# Patient Record
Sex: Female | Born: 1960 | Race: White | Hispanic: No | Marital: Married
Health system: Southern US, Community
[De-identification: ages and names within clinical notes are randomized; demographics above are authoritative.]

---

## 1998-03-10 ENCOUNTER — Other Ambulatory Visit: Admission: RE | Admit: 1998-03-10 | Discharge: 1998-03-10 | Payer: Self-pay | Admitting: Obstetrics and Gynecology

## 1999-03-13 ENCOUNTER — Other Ambulatory Visit: Admission: RE | Admit: 1999-03-13 | Discharge: 1999-03-13 | Payer: Self-pay | Admitting: Obstetrics and Gynecology

## 1999-03-30 ENCOUNTER — Other Ambulatory Visit: Admission: RE | Admit: 1999-03-30 | Discharge: 1999-03-30 | Payer: Self-pay | Admitting: Obstetrics and Gynecology

## 1999-03-30 ENCOUNTER — Encounter (INDEPENDENT_AMBULATORY_CARE_PROVIDER_SITE_OTHER): Payer: Self-pay

## 1999-04-10 ENCOUNTER — Encounter (INDEPENDENT_AMBULATORY_CARE_PROVIDER_SITE_OTHER): Payer: Self-pay | Admitting: Specialist

## 1999-04-10 ENCOUNTER — Ambulatory Visit (HOSPITAL_COMMUNITY): Admission: RE | Admit: 1999-04-10 | Discharge: 1999-04-10 | Payer: Self-pay | Admitting: Obstetrics and Gynecology

## 1999-08-17 ENCOUNTER — Other Ambulatory Visit: Admission: RE | Admit: 1999-08-17 | Discharge: 1999-08-17 | Payer: Self-pay | Admitting: Obstetrics and Gynecology

## 1999-12-26 ENCOUNTER — Other Ambulatory Visit: Admission: RE | Admit: 1999-12-26 | Discharge: 1999-12-26 | Payer: Self-pay | Admitting: Obstetrics and Gynecology

## 2000-03-28 ENCOUNTER — Other Ambulatory Visit: Admission: RE | Admit: 2000-03-28 | Discharge: 2000-03-28 | Payer: Self-pay | Admitting: Obstetrics and Gynecology

## 2000-10-03 ENCOUNTER — Other Ambulatory Visit: Admission: RE | Admit: 2000-10-03 | Discharge: 2000-10-03 | Payer: Self-pay | Admitting: Obstetrics and Gynecology

## 2001-03-20 ENCOUNTER — Encounter: Admission: RE | Admit: 2001-03-20 | Discharge: 2001-03-20 | Payer: Self-pay | Admitting: Obstetrics and Gynecology

## 2001-03-20 ENCOUNTER — Encounter: Payer: Self-pay | Admitting: Obstetrics and Gynecology

## 2001-03-20 ENCOUNTER — Other Ambulatory Visit: Admission: RE | Admit: 2001-03-20 | Discharge: 2001-03-20 | Payer: Self-pay | Admitting: Obstetrics and Gynecology

## 2001-08-12 ENCOUNTER — Other Ambulatory Visit: Admission: RE | Admit: 2001-08-12 | Discharge: 2001-08-12 | Payer: Self-pay | Admitting: Obstetrics and Gynecology

## 2002-03-31 ENCOUNTER — Other Ambulatory Visit: Admission: RE | Admit: 2002-03-31 | Discharge: 2002-03-31 | Payer: Self-pay | Admitting: Obstetrics and Gynecology

## 2002-03-31 ENCOUNTER — Encounter: Payer: Self-pay | Admitting: Obstetrics and Gynecology

## 2002-03-31 ENCOUNTER — Encounter: Admission: RE | Admit: 2002-03-31 | Discharge: 2002-03-31 | Payer: Self-pay | Admitting: Obstetrics and Gynecology

## 2003-08-10 ENCOUNTER — Encounter: Admission: RE | Admit: 2003-08-10 | Discharge: 2003-08-10 | Payer: Self-pay | Admitting: Obstetrics and Gynecology

## 2003-12-16 ENCOUNTER — Encounter (INDEPENDENT_AMBULATORY_CARE_PROVIDER_SITE_OTHER): Payer: Self-pay | Admitting: Specialist

## 2003-12-16 ENCOUNTER — Observation Stay (HOSPITAL_COMMUNITY): Admission: RE | Admit: 2003-12-16 | Discharge: 2003-12-17 | Payer: Self-pay | Admitting: Obstetrics and Gynecology

## 2004-08-10 ENCOUNTER — Encounter: Admission: RE | Admit: 2004-08-10 | Discharge: 2004-08-10 | Payer: Self-pay | Admitting: Otolaryngology

## 2005-10-01 ENCOUNTER — Encounter: Admission: RE | Admit: 2005-10-01 | Discharge: 2005-10-01 | Payer: Self-pay | Admitting: Family Medicine

## 2006-10-22 ENCOUNTER — Encounter: Admission: RE | Admit: 2006-10-22 | Discharge: 2006-10-22 | Payer: Self-pay | Admitting: Family Medicine

## 2007-10-23 ENCOUNTER — Encounter: Admission: RE | Admit: 2007-10-23 | Discharge: 2007-10-23 | Payer: Self-pay | Admitting: Family Medicine

## 2008-10-29 ENCOUNTER — Encounter: Admission: RE | Admit: 2008-10-29 | Discharge: 2008-10-29 | Payer: Self-pay | Admitting: Family Medicine

## 2009-11-01 ENCOUNTER — Encounter: Admission: RE | Admit: 2009-11-01 | Discharge: 2009-11-01 | Payer: Self-pay | Admitting: Family Medicine

## 2010-02-21 ENCOUNTER — Other Ambulatory Visit
Admission: RE | Admit: 2010-02-21 | Discharge: 2010-02-21 | Payer: Self-pay | Source: Home / Self Care | Admitting: Obstetrics and Gynecology

## 2010-06-30 NOTE — H&P (Signed)
Katherine Fox, Katherine Fox                 ACCOUNT NO.:  [id number]   MEDICAL RECORD NO.:  [id number]          PATIENT TYPE:  AMB   LOCATION:  SDC                           FACILITY:  [facility]   PHYSICIAN:  Lenoard Aden, M.D.DATE OF BIRTH:  [birth year]   DATE OF ADMISSION:  12/16/2003  DATE OF DISCHARGE:                                HISTORY & PHYSICAL   CHIEF COMPLAINT:  Persistent refractory menomenorrhagia.   HISTORY OF PRESENT ILLNESS:  The patient is a 50 year old white female,  gravida 1, para 1, with history of intermittent hematometra with a history  of cone biopsy and secondary cervical stenosis who presents for definitive  therapy.   ALLERGIES:  No known drug allergies.   MEDICATIONS:  Celebrex 20 mg p.o. p.r.n.   FAMILY HISTORY:  Noncontributory.  Domestic history is noncontributory as  well.   PAST SURGICAL HISTORY:  She does have a history of cold knife conization due  to severe dysplasia in 2001.   PAST OBSTETRICAL HISTORY:  Vaginal delivery uncomplicated.   PHYSICAL EXAMINATION:  GENERAL:  She is a well-developed, well-nourished,  white female in no acute distress.  HEENT:  Normal.  LUNGS:  Clear.  HEART:  Regular rate and rhythm.  ABDOMEN:  Soft and nontender.  PELVIC:  Anteflexed, not enlarged uterus with known cervical stenosis.  No  adnexal masses.   IMPRESSION:  1.  Menomenorrhagia.  2.  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  3.  History of severe dysplasia, status post cone biopsy.  4.  History of urethral dilatation and cystoscopy.   PLAN:  Proceed to laparoscopically-assisted vaginal hysterectomy, possible  abdominal hysterectomy.  Risks of anesthesia, infection, bleeding, injury to  abdominal organs with need for repair is discussed.  Delayed versus  immediate complications to include bowel and bladder injury noted.  The  patient acknowledges and wishes to proceed.      RJT/MEDQ  D:  12/15/2003  T:  12/15/2003  Job:  [id number]

## 2010-06-30 NOTE — Op Note (Signed)
[facility]  Patient:    Katherine Fox, Katherine Fox                        MRN: [id number] Proc. Date: 04/10/99 Adm. Date:  [id number] Disc. Date: [id number] Attending:  Lenoard Aden                           Operative Report  THIS NOTE WAS DICTATED PREVIOUSLY ON April 10, 1999.  DICTATION HAS BEEN LOST.  PREOPERATIVE DIAGNOSIS:       Severe dysplasia endocervical involvement.  POSTOPERATIVE DIAGNOSIS:      Severe dysplasia endocervical involvement.  OPERATION:                    Cold knife cervical conization and ECC.  SURGEON:                      Lenoard Aden, M.D.  ASSISTANT:  ANESTHESIA:                   General  ESTIMATED BLOOD LOSS:         50 cc  COMPLICATIONS:                None.  DRAINS:                       None.  COUNTS:                       Correct  DISPOSITION:                  The patient to recovery room in good condition.  SPECIMEN:                     Cone biopsy to pathology.  INDICATIONS:  DESCRIPTION OF PROCEDURE:     After being appraised of the risks of anesthesia,  infection, bleeding and inability to excise the entire specimen, the patient was brought to the operating room where she was administered general anesthetic without complications.  Feet are placed in the [facility] stirrups. At this time, an exam under anesthesia revealed a normal size uterus.  No adnexal masses.  Bivalve speculum was placed.  The cervix was coated using a Lugols solution and Stay sutures are placed at the cervical vaginal junction at 3 and 9:00 with 0 chromic sutures and held long.  The cervix was infiltrated with dilute xylocaine epinephrine solution and excision around the area of previously delineated transformation zone with Lugols solution is excised using a Beaver blade.  This is excised using scissors at the level of the endocervical margin and tagged at 12:00.  ECC is performed.  [place]anga is then cauterized.  Monsels  solution placed.  A piece of Surgicel is placed in  the cone bed and the Stay sutures at 3 and 9:00 are tied over the midline and cut. The procedure goes without difficulty.  There are no complications.  The patient tolerates the procedure well, to recovery room in good condition.DD:  05/03/99 TD:  05/04/99 Job: [id number]

## 2010-06-30 NOTE — Op Note (Signed)
NAMENORENA, BRATTON                 ACCOUNT NO.:  [id number]   MEDICAL RECORD NO.:  [id number]          PATIENT TYPE:  OBV   LOCATION:  9399                          FACILITY:  [facility]   PHYSICIAN:  Lenoard Aden, M.D.DATE OF BIRTH:  09/07/1960   DATE OF PROCEDURE:  12/16/2003  DATE OF DISCHARGE:                                 OPERATIVE REPORT   PREOPERATIVE DIAGNOSES:  1.  Recurrent menometrorrhagia.  2.  Cervical stenosis.  3.  Recurrent hematometria.   POSTOPERATIVE DIAGNOSES:  1.  Recurrent menometrorrhagia.  2.  Cervical stenosis.  3.  Recurrent hematometria.  4.  Enterocele.   PROCEDURE:  LAVH and McCall culdoplasty.   SURGEON:  Lenoard Aden, M.D.   ASSISTANT:  Genia Del, M.D.   ANESTHESIA:  Gen.   ESTIMATED BLOOD LOSS:  100 mL.   DRAINS:  Foley and vaginal pack.   COUNTS:  Correct.   The patient to recovery in good condition.   DESCRIPTION OF PROCEDURE:  After being apprised of the risks of anesthesia,  infection, bleeding, injury to abdominal organs, need for repair, delayed  versus immediate complications to include bowel and injury are noted. The  patient was brought to the operating room where she was administered general  anesthetic without complications.  Prepped and draped in the usual sterile  fashion, Foley catheter placed.  A vaginal exam reveals a small anteflexed  uterus, marked cervical stenosis which is relieved with the placement of the  hulka tenaculum.  Hematometria is relieved at this time.  Infraumbilical  incision made with a scalpel, after placement of a dilute Marcaine solution  Veress needle placed, opening pressure of -2 noted, 3 1/2 liters CO2  insufflated without difficulty.  A 10 mm trocar placed, 5 mm trocar sites  placed bilaterally mid clavicular line halfway in between the suprapubic and  umbilical area. There is some bleeding on placement of the right 5 mm trocar  site, both are done under local anesthetic. After  this, the left round  ligament is grasped and ligated using a tripolar. The tuboovarian round  ligament complex is then ligated as well with a tripolar. Bladder flap is  developed sharply, the same procedure is done on the right side. The uterine  vessels are skeletonized, ureters are identified. Anterior and posterior cul-  de-sac appear normal. There are bilateral normal ovaries, bilateral normal  tubes.  Appendix not visualized, gallbladder appears normal. At this time,  attention turned to the vaginal portion of the procedure whereby anterior  and posterior lip of the cervix is grasped, infiltrated using a dilute  Pitressin solution, circumscribed using electrocautery at the cervicovaginal  junction. Anterior and posterior cul-de-sac entry made sharply and without  difficulty and atraumatically retractors placed.  Uterosacral ligaments are  bilaterally clamped, suture ligated and attached to the vaginal cuff.  Ligature is used to clamp the uterine vessels and broad and cardinal  ligament complexes.  Specimen is removed.  McCall culdoplasty suture placed  using 2-0 Vicryl sutures, internal and external sutures are placed after  identification of an enterocele. Vaginal cuff closed  side to side using #0  Vicryl pop-off sutures in a figure-of-eight fashion.  Packing is placed,  revisualization reveals normal good postop result with no evidence of  bleeding from the surgical pedicles. Both ovaries appear normal and of  normal color. At this time, the right subdermal hematoma that appears in the  right lower quadrant is found to be bleeding from the trocar site. This is  cauterized using the Kleppinger bipolar from the operative port. Good  hemostasis is noted. No evidence of expansion is noted. Dermabond and #0  Vicryl suture place. All instruments and CO2 have been released from the  abdomen, #0 Vicryl placed in the umbilical port. The patient tolerated the  procedure well and was awakened  and transferred to recovery in good  condition.      RJT/MEDQ  D:  12/16/2003  T:  12/16/2003  Job:  [id number]

## 2010-09-28 ENCOUNTER — Other Ambulatory Visit: Payer: Self-pay | Admitting: Family Medicine

## 2010-09-28 DIAGNOSIS — Z1231 Encounter for screening mammogram for malignant neoplasm of breast: Secondary | ICD-10-CM

## 2010-11-07 ENCOUNTER — Ambulatory Visit
Admission: RE | Admit: 2010-11-07 | Discharge: 2010-11-07 | Disposition: A | Discharge status: Home / Self Care | Payer: Self-pay | Source: Ambulatory Visit | Attending: Family Medicine | Admitting: Family Medicine

## 2010-11-07 DIAGNOSIS — Z1231 Encounter for screening mammogram for malignant neoplasm of breast: Secondary | ICD-10-CM

## 2011-10-09 ENCOUNTER — Other Ambulatory Visit: Payer: Self-pay | Admitting: Family Medicine

## 2011-10-09 DIAGNOSIS — Z1231 Encounter for screening mammogram for malignant neoplasm of breast: Secondary | ICD-10-CM

## 2011-11-13 ENCOUNTER — Ambulatory Visit (INDEPENDENT_AMBULATORY_CARE_PROVIDER_SITE_OTHER): Payer: BC Managed Care – PPO

## 2011-11-13 DIAGNOSIS — Z1231 Encounter for screening mammogram for malignant neoplasm of breast: Secondary | ICD-10-CM

## 2012-10-15 ENCOUNTER — Other Ambulatory Visit: Payer: Self-pay | Admitting: Family Medicine

## 2012-10-15 DIAGNOSIS — Z1231 Encounter for screening mammogram for malignant neoplasm of breast: Secondary | ICD-10-CM

## 2012-11-20 ENCOUNTER — Ambulatory Visit (INDEPENDENT_AMBULATORY_CARE_PROVIDER_SITE_OTHER): Payer: BC Managed Care – PPO

## 2012-11-20 DIAGNOSIS — Z1231 Encounter for screening mammogram for malignant neoplasm of breast: Secondary | ICD-10-CM

## 2013-03-24 ENCOUNTER — Other Ambulatory Visit (HOSPITAL_COMMUNITY)
Admission: RE | Admit: 2013-03-24 | Discharge: 2013-03-24 | Disposition: A | Discharge status: Home / Self Care | Payer: BC Managed Care – PPO | Source: Ambulatory Visit | Attending: Nurse Practitioner | Admitting: Nurse Practitioner

## 2013-03-24 ENCOUNTER — Other Ambulatory Visit: Payer: Self-pay | Admitting: Nurse Practitioner

## 2013-03-24 DIAGNOSIS — Z1151 Encounter for screening for human papillomavirus (HPV): Secondary | ICD-10-CM | POA: Insufficient documentation

## 2013-03-24 DIAGNOSIS — Z124 Encounter for screening for malignant neoplasm of cervix: Secondary | ICD-10-CM | POA: Insufficient documentation

## 2013-03-24 DIAGNOSIS — R87810 Cervical high risk human papillomavirus (HPV) DNA test positive: Secondary | ICD-10-CM | POA: Insufficient documentation

## 2013-07-16 ENCOUNTER — Other Ambulatory Visit (HOSPITAL_COMMUNITY)
Admission: RE | Admit: 2013-07-16 | Discharge: 2013-07-16 | Disposition: A | Discharge status: Home / Self Care | Payer: BC Managed Care – PPO | Source: Ambulatory Visit | Attending: Obstetrics and Gynecology | Admitting: Obstetrics and Gynecology

## 2013-07-16 ENCOUNTER — Other Ambulatory Visit: Payer: Self-pay | Admitting: Nurse Practitioner

## 2013-07-16 DIAGNOSIS — Z01419 Encounter for gynecological examination (general) (routine) without abnormal findings: Secondary | ICD-10-CM | POA: Insufficient documentation

## 2013-07-20 LAB — CYTOLOGY - PAP

## 2013-10-20 ENCOUNTER — Other Ambulatory Visit: Payer: Self-pay | Admitting: Family Medicine

## 2013-10-20 DIAGNOSIS — Z1231 Encounter for screening mammogram for malignant neoplasm of breast: Secondary | ICD-10-CM

## 2013-11-26 ENCOUNTER — Ambulatory Visit (INDEPENDENT_AMBULATORY_CARE_PROVIDER_SITE_OTHER): Payer: BC Managed Care – PPO

## 2013-11-26 DIAGNOSIS — Z1231 Encounter for screening mammogram for malignant neoplasm of breast: Secondary | ICD-10-CM

## 2014-08-19 ENCOUNTER — Other Ambulatory Visit: Payer: Self-pay | Admitting: Nurse Practitioner

## 2014-08-19 ENCOUNTER — Other Ambulatory Visit (HOSPITAL_COMMUNITY)
Admission: RE | Admit: 2014-08-19 | Discharge: 2014-08-19 | Disposition: A | Discharge status: Home / Self Care | Payer: BLUE CROSS/BLUE SHIELD | Source: Ambulatory Visit | Attending: Nurse Practitioner | Admitting: Nurse Practitioner

## 2014-08-19 DIAGNOSIS — Z01411 Encounter for gynecological examination (general) (routine) with abnormal findings: Secondary | ICD-10-CM | POA: Diagnosis not present

## 2014-08-19 DIAGNOSIS — R87810 Cervical high risk human papillomavirus (HPV) DNA test positive: Secondary | ICD-10-CM | POA: Diagnosis present

## 2014-08-19 DIAGNOSIS — Z1151 Encounter for screening for human papillomavirus (HPV): Secondary | ICD-10-CM | POA: Diagnosis present

## 2014-08-23 LAB — CYTOLOGY - PAP

## 2014-11-01 ENCOUNTER — Other Ambulatory Visit: Payer: Self-pay | Admitting: Family Medicine

## 2014-11-01 DIAGNOSIS — Z1231 Encounter for screening mammogram for malignant neoplasm of breast: Secondary | ICD-10-CM

## 2014-12-02 ENCOUNTER — Ambulatory Visit (INDEPENDENT_AMBULATORY_CARE_PROVIDER_SITE_OTHER): Payer: BLUE CROSS/BLUE SHIELD

## 2014-12-02 DIAGNOSIS — Z1231 Encounter for screening mammogram for malignant neoplasm of breast: Secondary | ICD-10-CM

## 2015-09-01 ENCOUNTER — Other Ambulatory Visit (HOSPITAL_COMMUNITY)
Admission: RE | Admit: 2015-09-01 | Discharge: 2015-09-01 | Disposition: A | Discharge status: Home / Self Care | Payer: PRIVATE HEALTH INSURANCE | Source: Ambulatory Visit | Attending: Nurse Practitioner | Admitting: Nurse Practitioner

## 2015-09-01 ENCOUNTER — Other Ambulatory Visit: Payer: Self-pay | Admitting: Nurse Practitioner

## 2015-09-01 DIAGNOSIS — Z1151 Encounter for screening for human papillomavirus (HPV): Secondary | ICD-10-CM | POA: Insufficient documentation

## 2015-09-01 DIAGNOSIS — Z01419 Encounter for gynecological examination (general) (routine) without abnormal findings: Secondary | ICD-10-CM | POA: Insufficient documentation

## 2015-09-02 LAB — CYTOLOGY - PAP

## 2015-10-31 ENCOUNTER — Other Ambulatory Visit: Payer: Self-pay | Admitting: Family Medicine

## 2015-10-31 DIAGNOSIS — Z1231 Encounter for screening mammogram for malignant neoplasm of breast: Secondary | ICD-10-CM

## 2015-12-06 ENCOUNTER — Ambulatory Visit (INDEPENDENT_AMBULATORY_CARE_PROVIDER_SITE_OTHER): Payer: PRIVATE HEALTH INSURANCE

## 2015-12-06 DIAGNOSIS — Z1231 Encounter for screening mammogram for malignant neoplasm of breast: Secondary | ICD-10-CM | POA: Diagnosis not present

## 2016-09-04 ENCOUNTER — Other Ambulatory Visit: Payer: Self-pay | Admitting: Nurse Practitioner

## 2016-09-04 ENCOUNTER — Other Ambulatory Visit (HOSPITAL_COMMUNITY)
Admission: RE | Admit: 2016-09-04 | Discharge: 2016-09-04 | Disposition: A | Discharge status: Home / Self Care | Payer: PRIVATE HEALTH INSURANCE | Source: Ambulatory Visit | Attending: Nurse Practitioner | Admitting: Nurse Practitioner

## 2016-09-04 DIAGNOSIS — R87619 Unspecified abnormal cytological findings in specimens from cervix uteri: Secondary | ICD-10-CM | POA: Diagnosis not present

## 2016-09-06 LAB — CYTOLOGY - PAP
ADEQUACY: ABSENT — AB
DIAGNOSIS: UNDETERMINED — AB
HPV (WINDOPATH): NOT DETECTED

## 2016-10-25 ENCOUNTER — Other Ambulatory Visit: Payer: Self-pay | Admitting: Diagnostic Radiology

## 2016-10-25 DIAGNOSIS — Z1239 Encounter for other screening for malignant neoplasm of breast: Secondary | ICD-10-CM

## 2016-12-07 ENCOUNTER — Ambulatory Visit (INDEPENDENT_AMBULATORY_CARE_PROVIDER_SITE_OTHER): Payer: PRIVATE HEALTH INSURANCE

## 2016-12-07 DIAGNOSIS — Z1239 Encounter for other screening for malignant neoplasm of breast: Secondary | ICD-10-CM

## 2016-12-07 DIAGNOSIS — Z1231 Encounter for screening mammogram for malignant neoplasm of breast: Secondary | ICD-10-CM | POA: Diagnosis not present

## 2017-09-05 ENCOUNTER — Other Ambulatory Visit: Payer: Self-pay | Admitting: Nurse Practitioner

## 2017-09-05 ENCOUNTER — Other Ambulatory Visit (HOSPITAL_COMMUNITY)
Admission: RE | Admit: 2017-09-05 | Discharge: 2017-09-05 | Disposition: A | Discharge status: Home / Self Care | Payer: PRIVATE HEALTH INSURANCE | Source: Ambulatory Visit | Attending: Nurse Practitioner | Admitting: Nurse Practitioner

## 2017-09-05 DIAGNOSIS — Z01419 Encounter for gynecological examination (general) (routine) without abnormal findings: Secondary | ICD-10-CM | POA: Diagnosis not present

## 2017-09-11 LAB — CYTOLOGY - PAP
HPV 16/18/45 GENOTYPING: NEGATIVE
HPV: DETECTED — AB

## 2017-10-28 ENCOUNTER — Other Ambulatory Visit: Payer: Self-pay | Admitting: Family Medicine

## 2017-10-28 DIAGNOSIS — Z1231 Encounter for screening mammogram for malignant neoplasm of breast: Secondary | ICD-10-CM

## 2017-10-31 ENCOUNTER — Other Ambulatory Visit: Payer: Self-pay | Admitting: Nurse Practitioner

## 2017-10-31 ENCOUNTER — Other Ambulatory Visit (HOSPITAL_COMMUNITY)
Admission: RE | Admit: 2017-10-31 | Discharge: 2017-10-31 | Disposition: A | Discharge status: Home / Self Care | Payer: PRIVATE HEALTH INSURANCE | Source: Ambulatory Visit | Attending: Nurse Practitioner | Admitting: Nurse Practitioner

## 2017-10-31 DIAGNOSIS — B977 Papillomavirus as the cause of diseases classified elsewhere: Secondary | ICD-10-CM | POA: Insufficient documentation

## 2017-10-31 DIAGNOSIS — R87622 Low grade squamous intraepithelial lesion on cytologic smear of vagina (LGSIL): Secondary | ICD-10-CM | POA: Insufficient documentation

## 2017-11-05 LAB — CYTOLOGY - PAP: HPV: DETECTED — AB

## 2017-12-11 ENCOUNTER — Ambulatory Visit (INDEPENDENT_AMBULATORY_CARE_PROVIDER_SITE_OTHER): Payer: PRIVATE HEALTH INSURANCE

## 2017-12-11 DIAGNOSIS — Z1231 Encounter for screening mammogram for malignant neoplasm of breast: Secondary | ICD-10-CM

## 2017-12-18 ENCOUNTER — Other Ambulatory Visit: Payer: Self-pay | Admitting: Nurse Practitioner

## 2017-12-18 DIAGNOSIS — Z13820 Encounter for screening for osteoporosis: Secondary | ICD-10-CM

## 2018-01-13 ENCOUNTER — Ambulatory Visit
Admission: RE | Admit: 2018-01-13 | Discharge: 2018-01-13 | Disposition: A | Discharge status: Home / Self Care | Payer: PRIVATE HEALTH INSURANCE | Source: Ambulatory Visit | Attending: Nurse Practitioner | Admitting: Nurse Practitioner

## 2018-01-13 DIAGNOSIS — Z13820 Encounter for screening for osteoporosis: Secondary | ICD-10-CM

## 2018-09-09 ENCOUNTER — Other Ambulatory Visit (HOSPITAL_COMMUNITY)
Admission: RE | Admit: 2018-09-09 | Discharge: 2018-09-09 | Disposition: A | Discharge status: Home / Self Care | Payer: PRIVATE HEALTH INSURANCE | Source: Ambulatory Visit | Attending: Nurse Practitioner | Admitting: Nurse Practitioner

## 2018-09-09 ENCOUNTER — Other Ambulatory Visit: Payer: Self-pay | Admitting: Nurse Practitioner

## 2018-09-09 DIAGNOSIS — Z124 Encounter for screening for malignant neoplasm of cervix: Secondary | ICD-10-CM | POA: Diagnosis not present

## 2018-09-12 LAB — CYTOLOGY - PAP
Adequacy: ABSENT — AB
HPV: NOT DETECTED

## 2018-11-07 ENCOUNTER — Other Ambulatory Visit: Payer: Self-pay | Admitting: Family Medicine

## 2018-11-07 DIAGNOSIS — Z1231 Encounter for screening mammogram for malignant neoplasm of breast: Secondary | ICD-10-CM

## 2018-12-17 ENCOUNTER — Other Ambulatory Visit: Payer: Self-pay

## 2018-12-17 ENCOUNTER — Ambulatory Visit (INDEPENDENT_AMBULATORY_CARE_PROVIDER_SITE_OTHER): Payer: PRIVATE HEALTH INSURANCE

## 2018-12-17 DIAGNOSIS — Z1231 Encounter for screening mammogram for malignant neoplasm of breast: Secondary | ICD-10-CM

## 2019-10-14 IMAGING — MG DIGITAL SCREENING BILATERAL MAMMOGRAM WITH TOMO AND CAD
6 of 12 series · 6 of 36 positions shown · non-contrast
Comparison: Previous exam(s).

CLINICAL DATA: Screening.

EXAM:
DIGITAL SCREENING BILATERAL MAMMOGRAM WITH TOMO AND CAD

[R CC synth-2D]
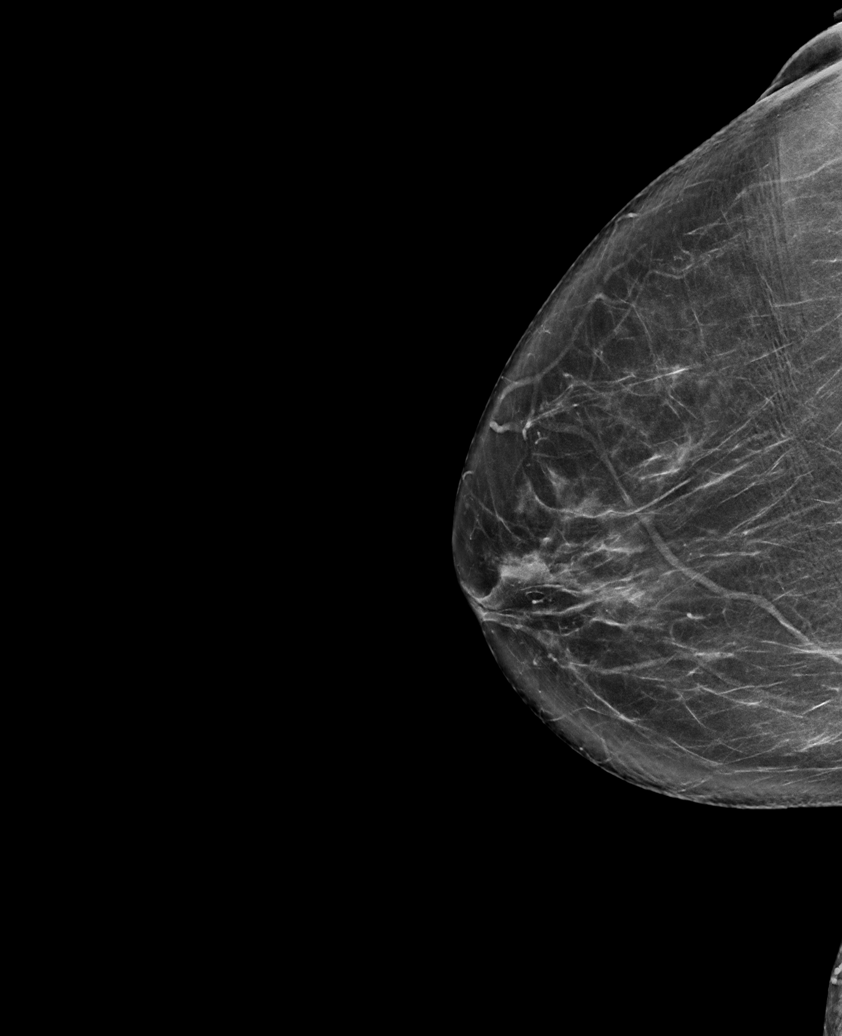

[L CC synth-2D]
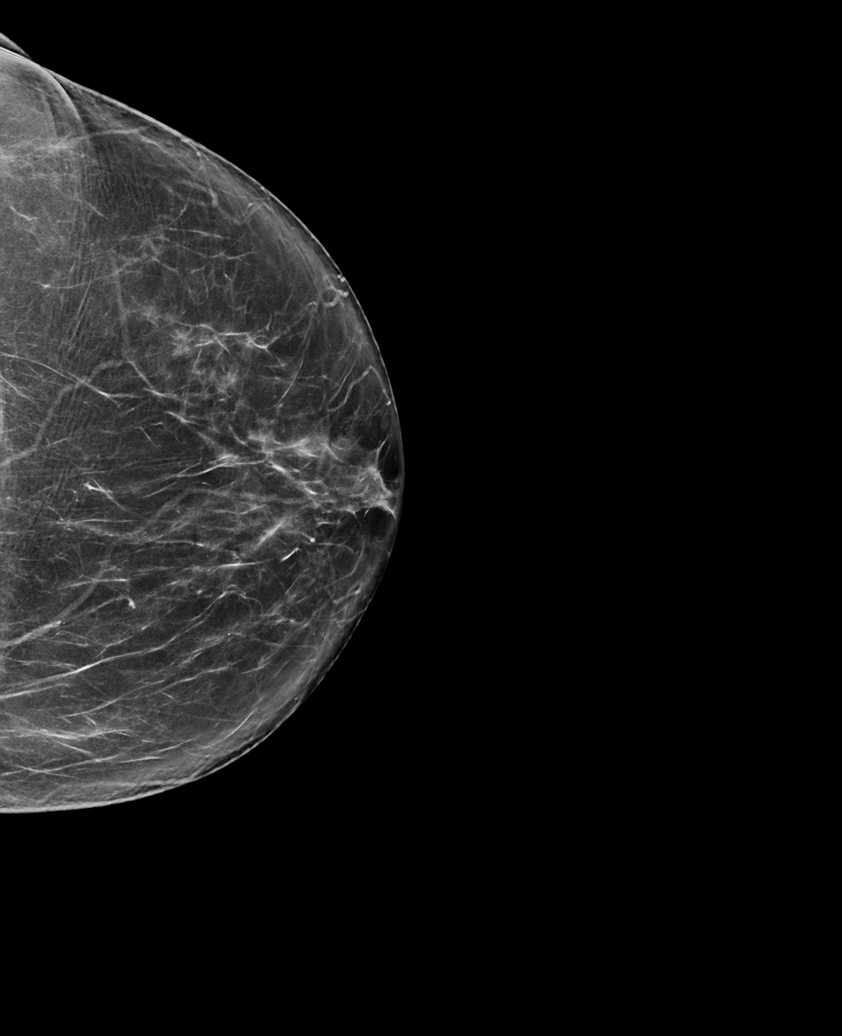

[R MLO synth-2D (1 of 2)]
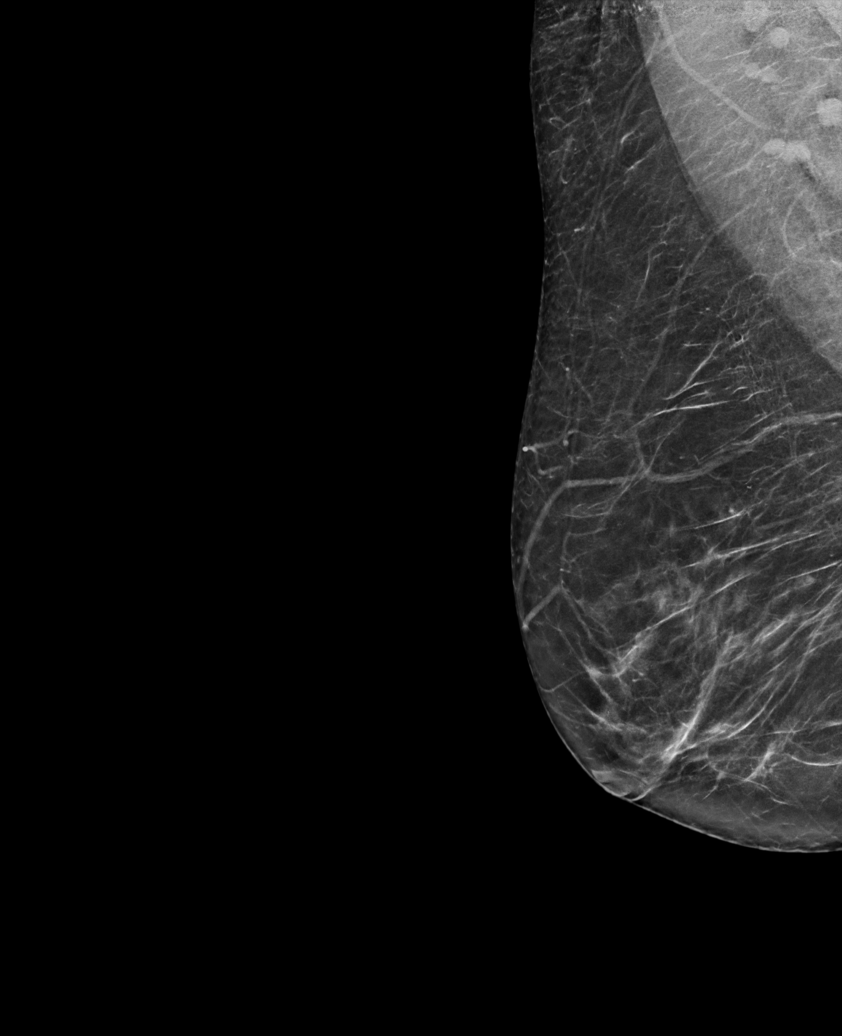

[R MLO synth-2D (2 of 2)]
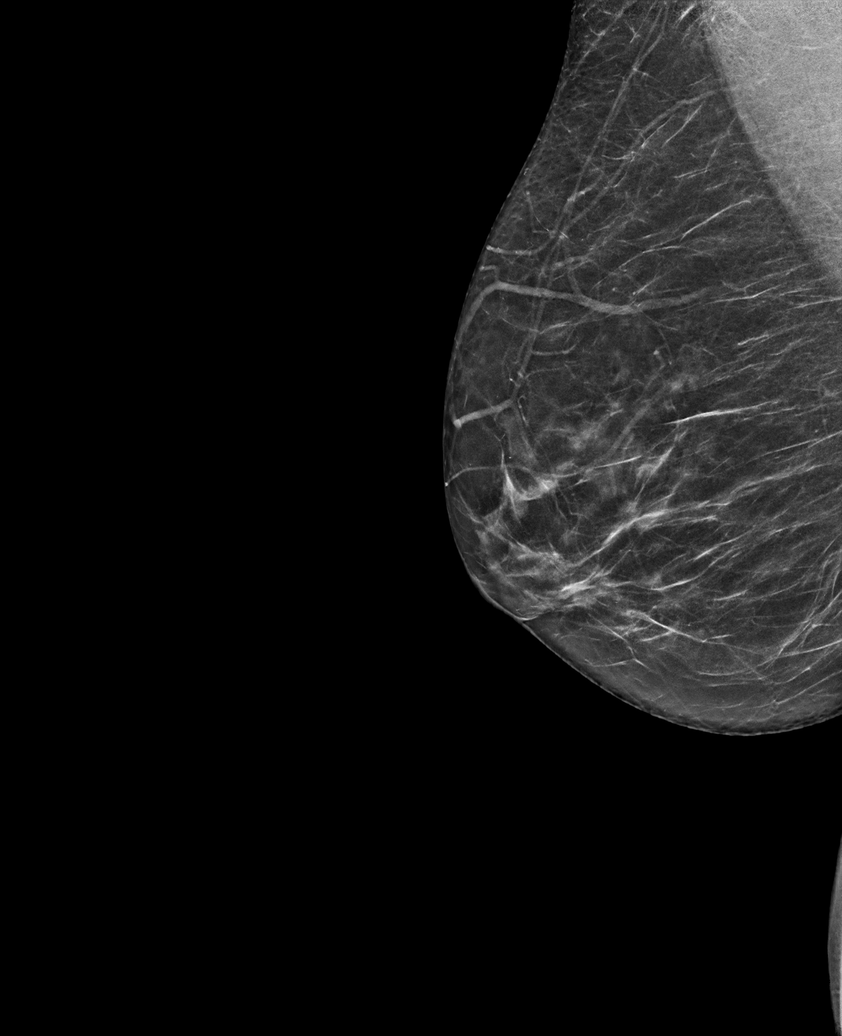

[L CV synth-2D]
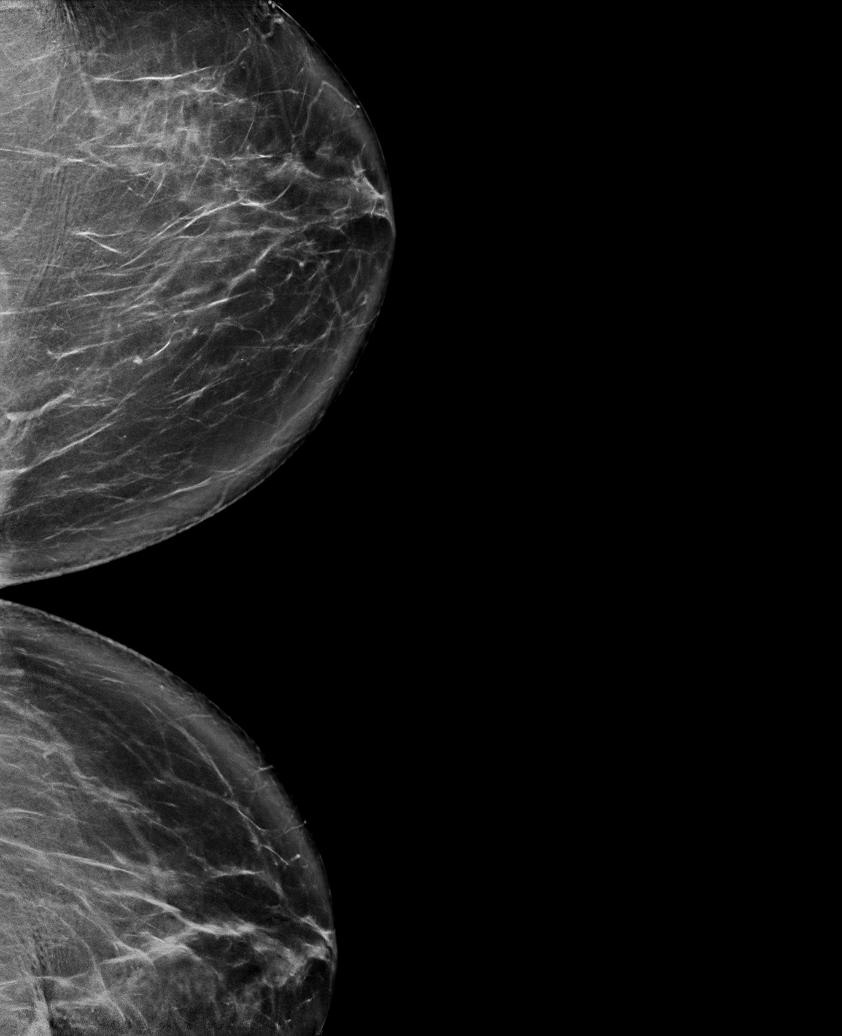

[L MLO synth-2D]
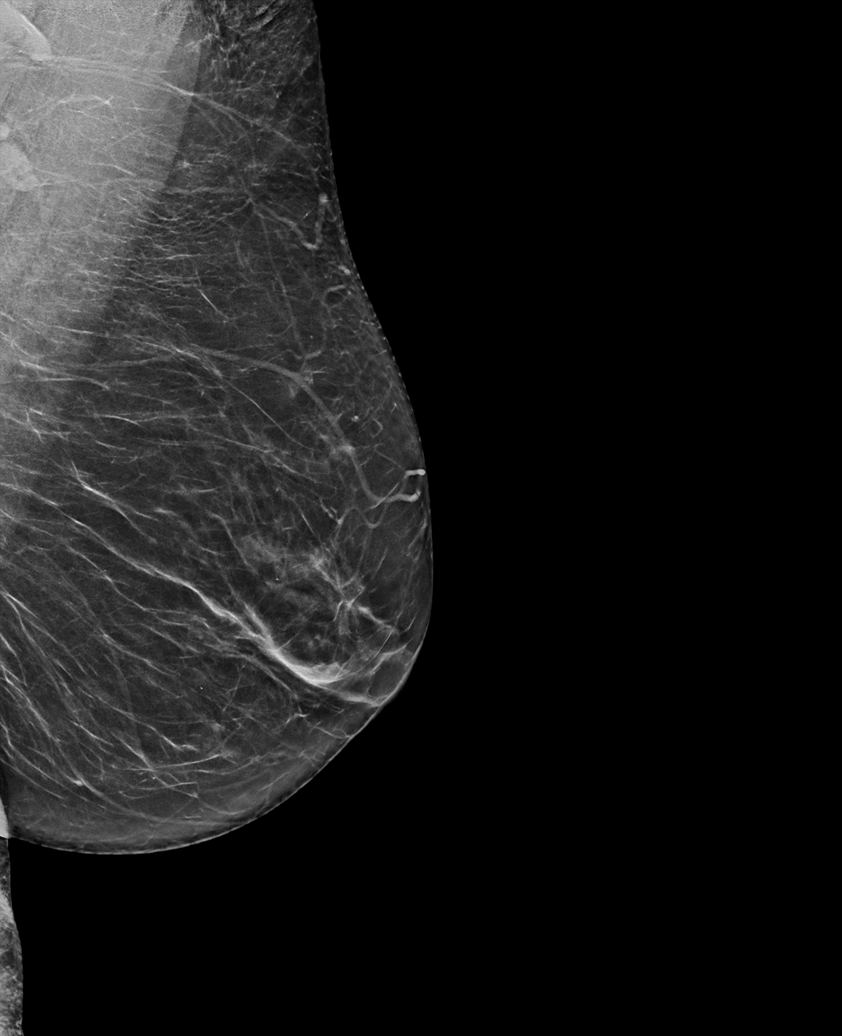

[6 of 36 positions shown; findings below may reference images not displayed]

ACR Breast Density Category b: There are scattered areas of
fibroglandular density.
FINDINGS: There are no findings suspicious for malignancy. Images were
processed with CAD.
IMPRESSION: No mammographic evidence of malignancy. A result letter of this
screening mammogram will be mailed directly to the patient.

RECOMMENDATION:
Screening mammogram in one year. (Code:CN-U-775)

BI-RADS CATEGORY  1: Negative.

## 2019-11-09 ENCOUNTER — Other Ambulatory Visit: Payer: Self-pay | Admitting: Family Medicine

## 2019-11-09 DIAGNOSIS — Z1231 Encounter for screening mammogram for malignant neoplasm of breast: Secondary | ICD-10-CM

## 2019-12-23 ENCOUNTER — Other Ambulatory Visit: Payer: Self-pay

## 2019-12-23 ENCOUNTER — Ambulatory Visit (INDEPENDENT_AMBULATORY_CARE_PROVIDER_SITE_OTHER): Payer: PRIVATE HEALTH INSURANCE

## 2019-12-23 DIAGNOSIS — Z1231 Encounter for screening mammogram for malignant neoplasm of breast: Secondary | ICD-10-CM

## 2020-04-29 ENCOUNTER — Other Ambulatory Visit (HOSPITAL_BASED_OUTPATIENT_CLINIC_OR_DEPARTMENT_OTHER): Payer: Self-pay

## 2020-05-04 ENCOUNTER — Other Ambulatory Visit (HOSPITAL_BASED_OUTPATIENT_CLINIC_OR_DEPARTMENT_OTHER): Payer: Self-pay

## 2020-08-11 ENCOUNTER — Other Ambulatory Visit: Payer: Self-pay | Admitting: Podiatry

## 2020-08-11 ENCOUNTER — Ambulatory Visit (INDEPENDENT_AMBULATORY_CARE_PROVIDER_SITE_OTHER): Payer: Self-pay

## 2020-08-11 ENCOUNTER — Ambulatory Visit (INDEPENDENT_AMBULATORY_CARE_PROVIDER_SITE_OTHER): Payer: No Typology Code available for payment source | Admitting: Podiatry

## 2020-08-11 ENCOUNTER — Other Ambulatory Visit: Payer: Self-pay

## 2020-08-11 DIAGNOSIS — M778 Other enthesopathies, not elsewhere classified: Secondary | ICD-10-CM

## 2020-08-11 DIAGNOSIS — S92505A Nondisplaced unspecified fracture of left lesser toe(s), initial encounter for closed fracture: Secondary | ICD-10-CM

## 2020-08-11 NOTE — Progress Notes (Signed)
  Subjective:  Patient ID: Katherine Fox, female    DOB: 1961-01-12,  MRN: [id number] HPI Chief Complaint  Patient presents with   Toe Injury    Left 5th digit injury June 4th, still painful - particularly with bending and wearing shoe gear.    60 y.o. female presents with the above complaint.   ROS: She denies fever chills nausea vomiting muscle aches pains calf pain back pain chest pain shortness of breath.  Was preparing to leave on holiday when she stubbed her toe into her suitcase.  She went to primary care to have it evaluated radiographically and it was noted to be no fractures.  No past medical history on file. No past surgical history on file.  Current Outpatient Medications:    Calcium Carbonate-Vit D-Min (CALCIUM 1200 PO), Take by mouth., Disp: , Rfl:    cholecalciferol (VITAMIN D3) 25 MCG (1000 UNIT) tablet, Take 1,000 Units by mouth daily., Disp: , Rfl:    levothyroxine (SYNTHROID) 50 MCG tablet, Take 50 mcg by mouth every morning., Disp: , Rfl:   No Known Allergies Review of Systems Objective:  There were no vitals filed for this visit.  General: Well developed, nourished, in no acute distress, alert and oriented x3   Dermatological: Skin is warm, dry and supple bilateral. Nails x 10 are well maintained; remaining integument appears unremarkable at this time. There are no open sores, no preulcerative lesions, no rash or signs of infection present.  Vascular: Dorsalis Pedis artery and Posterior Tibial artery pedal pulses are 2/4 bilateral with immedate capillary fill time. Pedal hair growth present. No varicosities and no lower extremity edema present bilateral.   Neruologic: Grossly intact via light touch bilateral. Vibratory intact via tuning fork bilateral. Protective threshold with Semmes Wienstein monofilament intact to all pedal sites bilateral. Patellar and Achilles deep tendon reflexes 2+ bilateral. No Babinski or clonus noted bilateral.   Musculoskeletal: No  gross boney pedal deformities bilateral. No pain, crepitus, or limitation noted with foot and ankle range of motion bilateral. Muscular strength 5/5 in all groups tested bilateral.  She has a rigid swollen fifth PIPJ left foot.  Tender on palpation and range of motion.  Gait: Unassisted, Nonantalgic.    Radiographs:  Osseously mature individual demonstrates a compression fracture of the head of the proximal phalanx with a small fragment laterally.  This is minimally dislocated none comminuted.  Assessment & Plan:   Assessment: Fracture fifth digit left foot nondisplaced  Plan: Discussed etiology pathology conservative versus surgical therapies at this point I showed her how to wrap the toe with Coban and I will follow-up with her on an as-needed basis expressed to her that it would take approximately 6 to 8 weeks to heal.     Axton Cihlar T. [place], [place]

## 2020-11-15 ENCOUNTER — Other Ambulatory Visit: Payer: Self-pay | Admitting: Family Medicine

## 2020-11-15 DIAGNOSIS — Z1231 Encounter for screening mammogram for malignant neoplasm of breast: Secondary | ICD-10-CM

## 2020-12-28 ENCOUNTER — Other Ambulatory Visit: Payer: Self-pay

## 2020-12-28 ENCOUNTER — Ambulatory Visit (INDEPENDENT_AMBULATORY_CARE_PROVIDER_SITE_OTHER): Payer: No Typology Code available for payment source

## 2020-12-28 DIAGNOSIS — Z1231 Encounter for screening mammogram for malignant neoplasm of breast: Secondary | ICD-10-CM | POA: Diagnosis not present

## 2021-09-19 ENCOUNTER — Other Ambulatory Visit (HOSPITAL_COMMUNITY)
Admission: RE | Admit: 2021-09-19 | Discharge: 2021-09-19 | Disposition: A | Discharge status: Home / Self Care | Payer: No Typology Code available for payment source | Source: Ambulatory Visit | Attending: Nurse Practitioner | Admitting: Nurse Practitioner

## 2021-09-19 DIAGNOSIS — R87811 Vaginal high risk human papillomavirus (HPV) DNA test positive: Secondary | ICD-10-CM | POA: Diagnosis not present

## 2021-09-20 ENCOUNTER — Other Ambulatory Visit: Payer: Self-pay | Admitting: Nurse Practitioner

## 2021-09-20 ENCOUNTER — Other Ambulatory Visit: Payer: Self-pay | Admitting: Family Medicine

## 2021-09-20 DIAGNOSIS — Z1231 Encounter for screening mammogram for malignant neoplasm of breast: Secondary | ICD-10-CM

## 2021-09-20 DIAGNOSIS — M85851 Other specified disorders of bone density and structure, right thigh: Secondary | ICD-10-CM

## 2021-09-25 LAB — CYTOLOGY - PAP
Adequacy: ABSENT
Comment: NEGATIVE
Diagnosis: UNDETERMINED — AB
High risk HPV: NEGATIVE

## 2022-01-10 ENCOUNTER — Ambulatory Visit (INDEPENDENT_AMBULATORY_CARE_PROVIDER_SITE_OTHER): Payer: No Typology Code available for payment source

## 2022-01-10 DIAGNOSIS — Z1231 Encounter for screening mammogram for malignant neoplasm of breast: Secondary | ICD-10-CM | POA: Diagnosis not present

## 2022-01-25 ENCOUNTER — Ambulatory Visit: Payer: No Typology Code available for payment source

## 2022-03-08 ENCOUNTER — Ambulatory Visit
Admission: RE | Admit: 2022-03-08 | Discharge: 2022-03-08 | Disposition: A | Discharge status: Home / Self Care | Payer: No Typology Code available for payment source | Source: Ambulatory Visit | Attending: Nurse Practitioner | Admitting: Nurse Practitioner

## 2022-03-08 DIAGNOSIS — M85851 Other specified disorders of bone density and structure, right thigh: Secondary | ICD-10-CM

## 2022-09-24 ENCOUNTER — Other Ambulatory Visit (HOSPITAL_COMMUNITY)
Admission: RE | Admit: 2022-09-24 | Discharge: 2022-09-24 | Disposition: A | Discharge status: Home / Self Care | Payer: No Typology Code available for payment source | Source: Ambulatory Visit | Attending: Nurse Practitioner | Admitting: Nurse Practitioner

## 2022-09-24 ENCOUNTER — Other Ambulatory Visit: Payer: Self-pay | Admitting: Nurse Practitioner

## 2022-09-24 DIAGNOSIS — Z124 Encounter for screening for malignant neoplasm of cervix: Secondary | ICD-10-CM | POA: Insufficient documentation

## 2022-09-27 LAB — CYTOLOGY - PAP
Adequacy: ABSENT
Comment: NEGATIVE
Diagnosis: NEGATIVE
High risk HPV: NEGATIVE

## 2022-12-04 ENCOUNTER — Other Ambulatory Visit: Payer: Self-pay | Admitting: Family Medicine

## 2022-12-04 DIAGNOSIS — Z1231 Encounter for screening mammogram for malignant neoplasm of breast: Secondary | ICD-10-CM

## 2023-01-16 ENCOUNTER — Ambulatory Visit: Payer: No Typology Code available for payment source

## 2023-01-16 DIAGNOSIS — Z1231 Encounter for screening mammogram for malignant neoplasm of breast: Secondary | ICD-10-CM

## 2023-07-31 ENCOUNTER — Ambulatory Visit: Admitting: Cardiology

## 2023-09-24 ENCOUNTER — Telehealth: Payer: Self-pay | Admitting: Neurology

## 2023-09-24 NOTE — Telephone Encounter (Signed)
 Request to cx appointment

## 2023-10-29 ENCOUNTER — Ambulatory Visit: Admitting: Neurology

## 2024-01-24 ENCOUNTER — Other Ambulatory Visit: Payer: Self-pay | Admitting: Family Medicine

## 2024-01-24 DIAGNOSIS — Z1231 Encounter for screening mammogram for malignant neoplasm of breast: Secondary | ICD-10-CM

## 2024-02-15 ENCOUNTER — Other Ambulatory Visit: Payer: Self-pay | Admitting: Medical Genetics

## 2024-02-19 ENCOUNTER — Ambulatory Visit
Admission: RE | Admit: 2024-02-19 | Discharge: 2024-02-19 | Disposition: A | Discharge status: Home / Self Care | Source: Ambulatory Visit | Attending: Family Medicine | Admitting: Family Medicine

## 2024-02-19 DIAGNOSIS — Z1231 Encounter for screening mammogram for malignant neoplasm of breast: Secondary | ICD-10-CM

## 2024-02-26 ENCOUNTER — Ambulatory Visit
Admission: RE | Admit: 2024-02-26 | Discharge: 2024-02-26 | Disposition: A | Discharge status: Home / Self Care | Payer: Self-pay | Attending: Medical Genetics | Admitting: Medical Genetics

## 2024-02-26 DIAGNOSIS — Z006 Encounter for examination for normal comparison and control in clinical research program: Secondary | ICD-10-CM

## 2024-03-06 LAB — GENECONNECT MOLECULAR SCREEN: Genetic Analysis Overall Interpretation: NEGATIVE
